# Patient Record
Sex: Male | Born: 1965
Health system: Southern US, Community
[De-identification: ages and names within clinical notes are randomized; demographics above are authoritative.]

---

## 2021-01-04 ENCOUNTER — Other Ambulatory Visit (HOSPITAL_COMMUNITY): Payer: Self-pay | Admitting: Emergency Medicine

## 2021-01-04 ENCOUNTER — Encounter (HOSPITAL_BASED_OUTPATIENT_CLINIC_OR_DEPARTMENT_OTHER): Payer: Self-pay | Admitting: Emergency Medicine

## 2021-01-04 ENCOUNTER — Emergency Department (HOSPITAL_BASED_OUTPATIENT_CLINIC_OR_DEPARTMENT_OTHER): Payer: Worker's Compensation

## 2021-01-04 ENCOUNTER — Other Ambulatory Visit: Payer: Self-pay

## 2021-01-04 ENCOUNTER — Emergency Department (HOSPITAL_BASED_OUTPATIENT_CLINIC_OR_DEPARTMENT_OTHER)
Admission: EM | Admit: 2021-01-04 | Discharge: 2021-01-04 | Disposition: A | Discharge status: Home / Self Care | Payer: Worker's Compensation | Attending: Emergency Medicine | Admitting: Emergency Medicine

## 2021-01-04 DIAGNOSIS — S59911A Unspecified injury of right forearm, initial encounter: Secondary | ICD-10-CM | POA: Diagnosis present

## 2021-01-04 DIAGNOSIS — Y990 Civilian activity done for income or pay: Secondary | ICD-10-CM | POA: Diagnosis not present

## 2021-01-04 DIAGNOSIS — Y9389 Activity, other specified: Secondary | ICD-10-CM | POA: Diagnosis not present

## 2021-01-04 DIAGNOSIS — Y9289 Other specified places as the place of occurrence of the external cause: Secondary | ICD-10-CM | POA: Insufficient documentation

## 2021-01-04 DIAGNOSIS — S56911A Strain of unspecified muscles, fascia and tendons at forearm level, right arm, initial encounter: Secondary | ICD-10-CM | POA: Diagnosis not present

## 2021-01-04 DIAGNOSIS — X500XXA Overexertion from strenuous movement or load, initial encounter: Secondary | ICD-10-CM | POA: Diagnosis not present

## 2021-01-04 LAB — CBC WITH DIFFERENTIAL/PLATELET
Abs Immature Granulocytes: 0.03 10*3/uL (ref 0.00–0.07)
Basophils Absolute: 0.1 10*3/uL (ref 0.0–0.1)
Basophils Relative: 1 %
Eosinophils Absolute: 0.2 10*3/uL (ref 0.0–0.5)
Eosinophils Relative: 3 %
HCT: 46.6 % (ref 39.0–52.0)
Hemoglobin: 15.6 g/dL (ref 13.0–17.0)
Immature Granulocytes: 1 %
Lymphocytes Relative: 25 %
Lymphs Abs: 1.6 10*3/uL (ref 0.7–4.0)
MCH: 30 pg (ref 26.0–34.0)
MCHC: 33.5 g/dL (ref 30.0–36.0)
MCV: 89.6 fL (ref 80.0–100.0)
Monocytes Absolute: 0.5 10*3/uL (ref 0.1–1.0)
Monocytes Relative: 8 %
Neutro Abs: 4 10*3/uL (ref 1.7–7.7)
Neutrophils Relative %: 62 %
Platelets: 252 10*3/uL (ref 150–400)
RBC: 5.2 MIL/uL (ref 4.22–5.81)
RDW: 12 % (ref 11.5–15.5)
WBC: 6.4 10*3/uL (ref 4.0–10.5)
nRBC: 0 % (ref 0.0–0.2)

## 2021-01-04 LAB — BASIC METABOLIC PANEL
Anion gap: 8 (ref 5–15)
BUN: 20 mg/dL (ref 6–20)
CO2: 25 mmol/L (ref 22–32)
Calcium: 9.2 mg/dL (ref 8.9–10.3)
Chloride: 106 mmol/L (ref 98–111)
Creatinine, Ser: 0.79 mg/dL (ref 0.61–1.24)
GFR, Estimated: >60 mL/min (ref >60)
Glucose, Bld: 111 mg/dL — ABNORMAL HIGH (ref 70–99)
Potassium: 4.2 mmol/L (ref 3.5–5.1)
Sodium: 139 mmol/L (ref 135–145)

## 2021-01-04 LAB — CK: Total CK: 125 U/L (ref 49–397)

## 2021-01-04 MED ORDER — METHOCARBAMOL 500 MG PO TABS: 500.0000 mg | ORAL_TABLET | Freq: Two times a day (BID) | ORAL | 0 refills | Status: AC

## 2021-01-04 MED ORDER — KETOROLAC TROMETHAMINE 30 MG/ML IJ SOLN
30.0000 mg | Freq: Once | INTRAMUSCULAR | Status: AC
  Administered 2021-01-04: 30 mg via INTRAVENOUS
  Filled 2021-01-04: qty 1

## 2021-01-04 MED ORDER — KETOROLAC TROMETHAMINE 30 MG/ML IJ SOLN: 30.0000 mg | Freq: Once | INTRAMUSCULAR | Status: DC

## 2021-01-04 MED ORDER — IBUPROFEN 600 MG PO TABS: 600.0000 mg | ORAL_TABLET | Freq: Four times a day (QID) | ORAL | 0 refills | Status: AC | PRN

## 2021-01-04 MED ORDER — SODIUM CHLORIDE 0.9 % IV BOLUS
1000.0000 mL | Freq: Once | INTRAVENOUS | Status: AC
  Administered 2021-01-04: 1000 mL via INTRAVENOUS

## 2021-01-04 MED FILL — IBUPROFEN 600 MG TABLET: 600 | 7 days supply | Qty: 30 | Fill #0

## 2021-01-04 MED FILL — METHOCARBAMOL 500 MG TABLET: 500 | 10 days supply | Qty: 20 | Fill #0

## 2021-01-04 NOTE — ED Triage Notes (Addendum)
Was lifting a heavy door yesterday at work  and lost control tried to catch it with rt arm  Hurt wrist to  Upper arm elbow and wrist pain hurt to move  Good pulse , neuro and < 3 sec cap refill

## 2021-01-04 NOTE — ED Provider Notes (Signed)
[facility] EMERGENCY DEPARTMENT Provider Note   CSN: [id number] Arrival date & time: 01/04/21  8469     History Chief Complaint  Patient presents with  . Arm Pain    Randy Wells is a 55 y.o. male.  Pt presents to the ED today with right arm pain.  Pt said he was lifting a heavy door at work yesterday and it started to fall.  He was able to keep it from falling, but he felt like he strained his right arm.  Pt said it hurts to extend his right wrist.  He is right handed.          History reviewed. No pertinent past medical history.  There are no problems to display for this patient.   No family history on file.  Social History   Tobacco Use  . Smoking status: Never Smoker  . Smokeless tobacco: Never Used  Substance Use Topics  . Alcohol use: Yes  . Drug use: Never    Home Medications Prior to Admission medications   Medication Sig Start Date End Date Taking? Authorizing Provider  ibuprofen (ADVIL) 600 MG tablet Take 1 tablet (600 mg total) by mouth every 6 (six) hours as needed. 01/04/21  Yes Jacalyn Lefevre, MD  methocarbamol (ROBAXIN) 500 MG tablet Take 1 tablet (500 mg total) by mouth 2 (two) times daily. 01/04/21  Yes Jacalyn Lefevre, MD    Allergies    Patient has no known allergies.  Review of Systems   Review of Systems  Musculoskeletal:       Right forearm pain  All other systems reviewed and are negative.   Physical Exam Updated Vital Signs BP 131/67 (BP Location: Right Arm)   Pulse 78   Temp 98.4 F (36.9 C) (Oral)   Resp 16   Ht 5\' 7"  (1.702 m)   Wt 94.8 kg   SpO2 99%   BMI 32.73 kg/m   Physical Exam Vitals and nursing note reviewed.  Constitutional:      Appearance: Normal appearance.  HENT:     Head: Normocephalic and atraumatic.     Right Ear: External ear normal.     Left Ear: External ear normal.     Nose: Nose normal.     Mouth/Throat:     Mouth: Mucous membranes are moist.     Pharynx: Oropharynx is clear.   Eyes:     Extraocular Movements: Extraocular movements intact.     Conjunctiva/sclera: Conjunctivae normal.     Pupils: Pupils are equal, round, and reactive to light.  Cardiovascular:     Rate and Rhythm: Normal rate and regular rhythm.     Pulses: Normal pulses.     Heart sounds: Normal heart sounds.  Pulmonary:     Effort: Pulmonary effort is normal.     Breath sounds: Normal breath sounds.  Abdominal:     General: Abdomen is flat. Bowel sounds are normal.     Palpations: Abdomen is soft.  Musculoskeletal:     Cervical back: Normal range of motion and neck supple.     Comments: Right bracheoradialis m tenderness and swelling.  Compartments soft  Skin:    General: Skin is warm.     Capillary Refill: Capillary refill takes less than 2 seconds.  Neurological:     General: No focal deficit present.     Mental Status: He is alert and oriented to person, place, and time.  Psychiatric:        Mood and Affect:  Mood normal.        Behavior: Behavior normal.     ED Results / Procedures / Treatments   Labs (all labs ordered are listed, but only abnormal results are displayed) Labs Reviewed  BASIC METABOLIC PANEL - Abnormal; Notable for the following components:      Result Value   Glucose, Bld 111 (*)    All other components within normal limits  CBC WITH DIFFERENTIAL/PLATELET  CK    EKG None  Radiology DG Forearm Right  Result Date: 01/04/2021 CLINICAL DATA:  Pain status post injury EXAM: RIGHT FOREARM - 2 VIEW COMPARISON:  None. FINDINGS: There is no evidence of fracture or other focal bone lesions. Soft tissues are unremarkable. IMPRESSION: Negative. Electronically Signed   By: Acquanetta Belling M.D.   On: 01/04/2021 10:57    Procedures Procedures   Medications Ordered in ED Medications  ketorolac (TORADOL) 30 MG/ML injection 30 mg (30 mg Intravenous Given 01/04/21 1046)  sodium chloride 0.9 % bolus 1,000 mL (1,000 mLs Intravenous New Bag/Given 01/04/21 1050)    ED  Course  I have reviewed the triage vital signs and the nursing notes.  Pertinent labs & imaging results that were available during my care of the patient were reviewed by me and considered in my medical decision making (see chart for details).    MDM Rules/Calculators/A&P                          xrays neg.  No rhabdo.  Pt is stable for d/c.  He is to f/u with ortho if sx persist.  Return if worse.  Final Clinical Impression(s) / ED Diagnoses Final diagnoses:  Forearm strain, right, initial encounter    Rx / DC Orders ED Discharge Orders         Ordered    ibuprofen (ADVIL) 600 MG tablet  Every 6 hours PRN        01/04/21 1103    methocarbamol (ROBAXIN) 500 MG tablet  2 times daily        01/04/21 1103           Jacalyn Lefevre, MD 01/04/21 1105

## 2022-01-27 IMAGING — CR DG FOREARM 2V*R*
2 series · 2 of 2 positions shown · non-contrast
Comparison: None.

CLINICAL DATA: Pain status post injury

EXAM:
RIGHT FOREARM - 2 VIEW

[x forearm ap right]
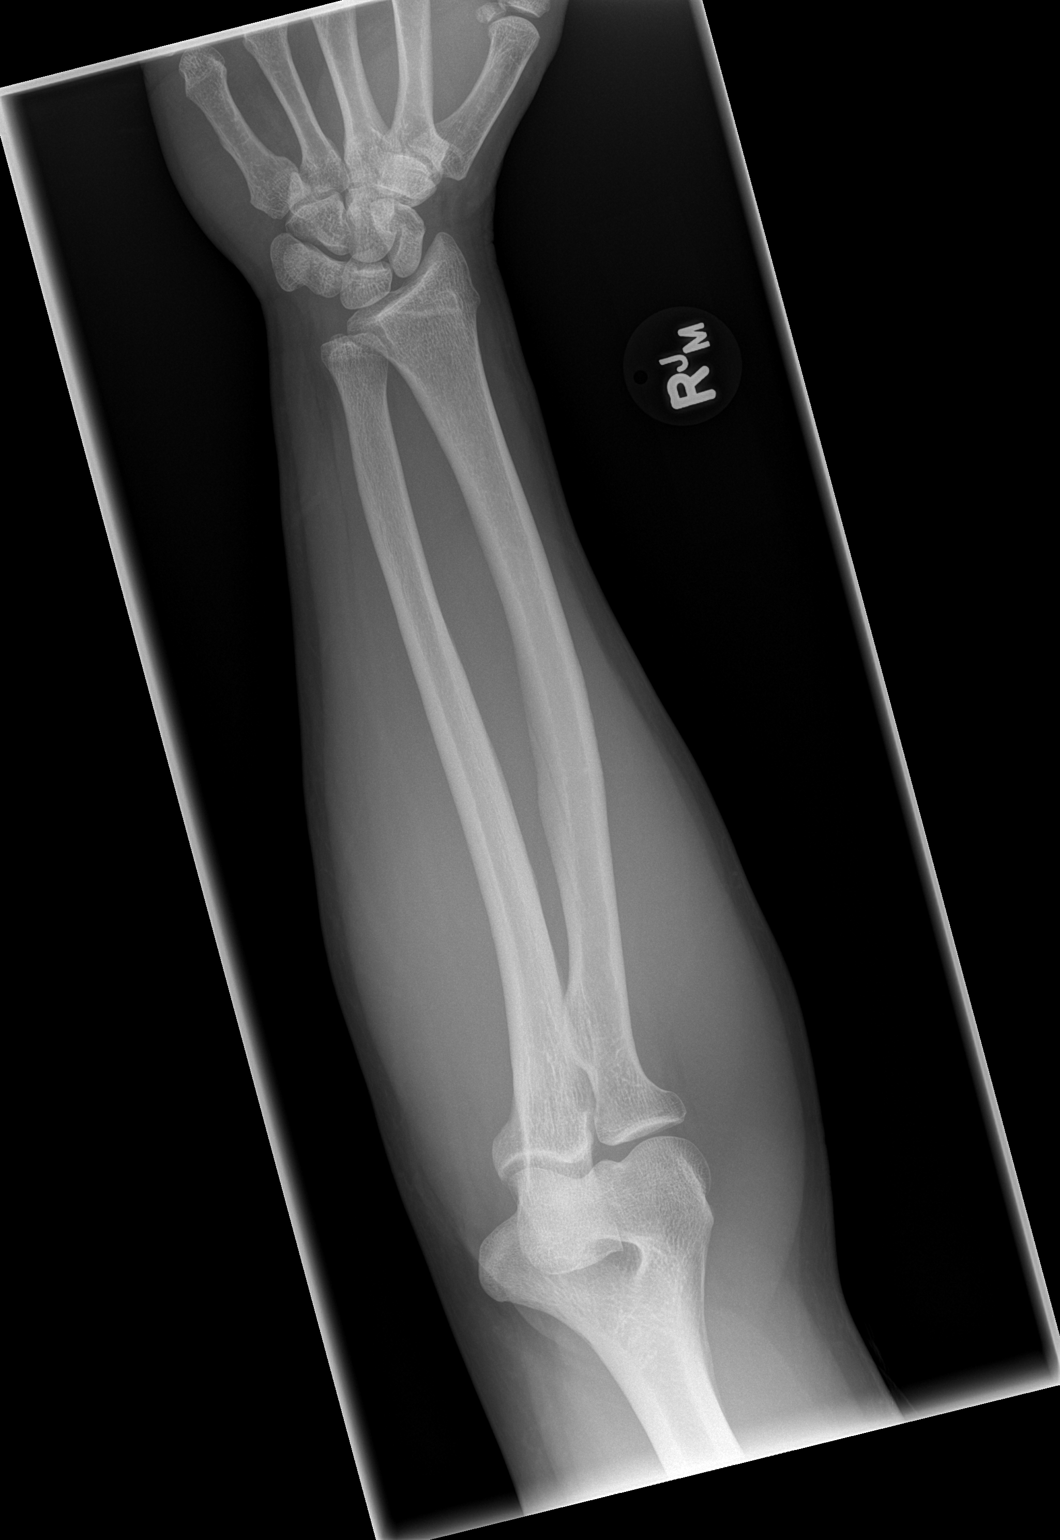

[x forearm lat right]
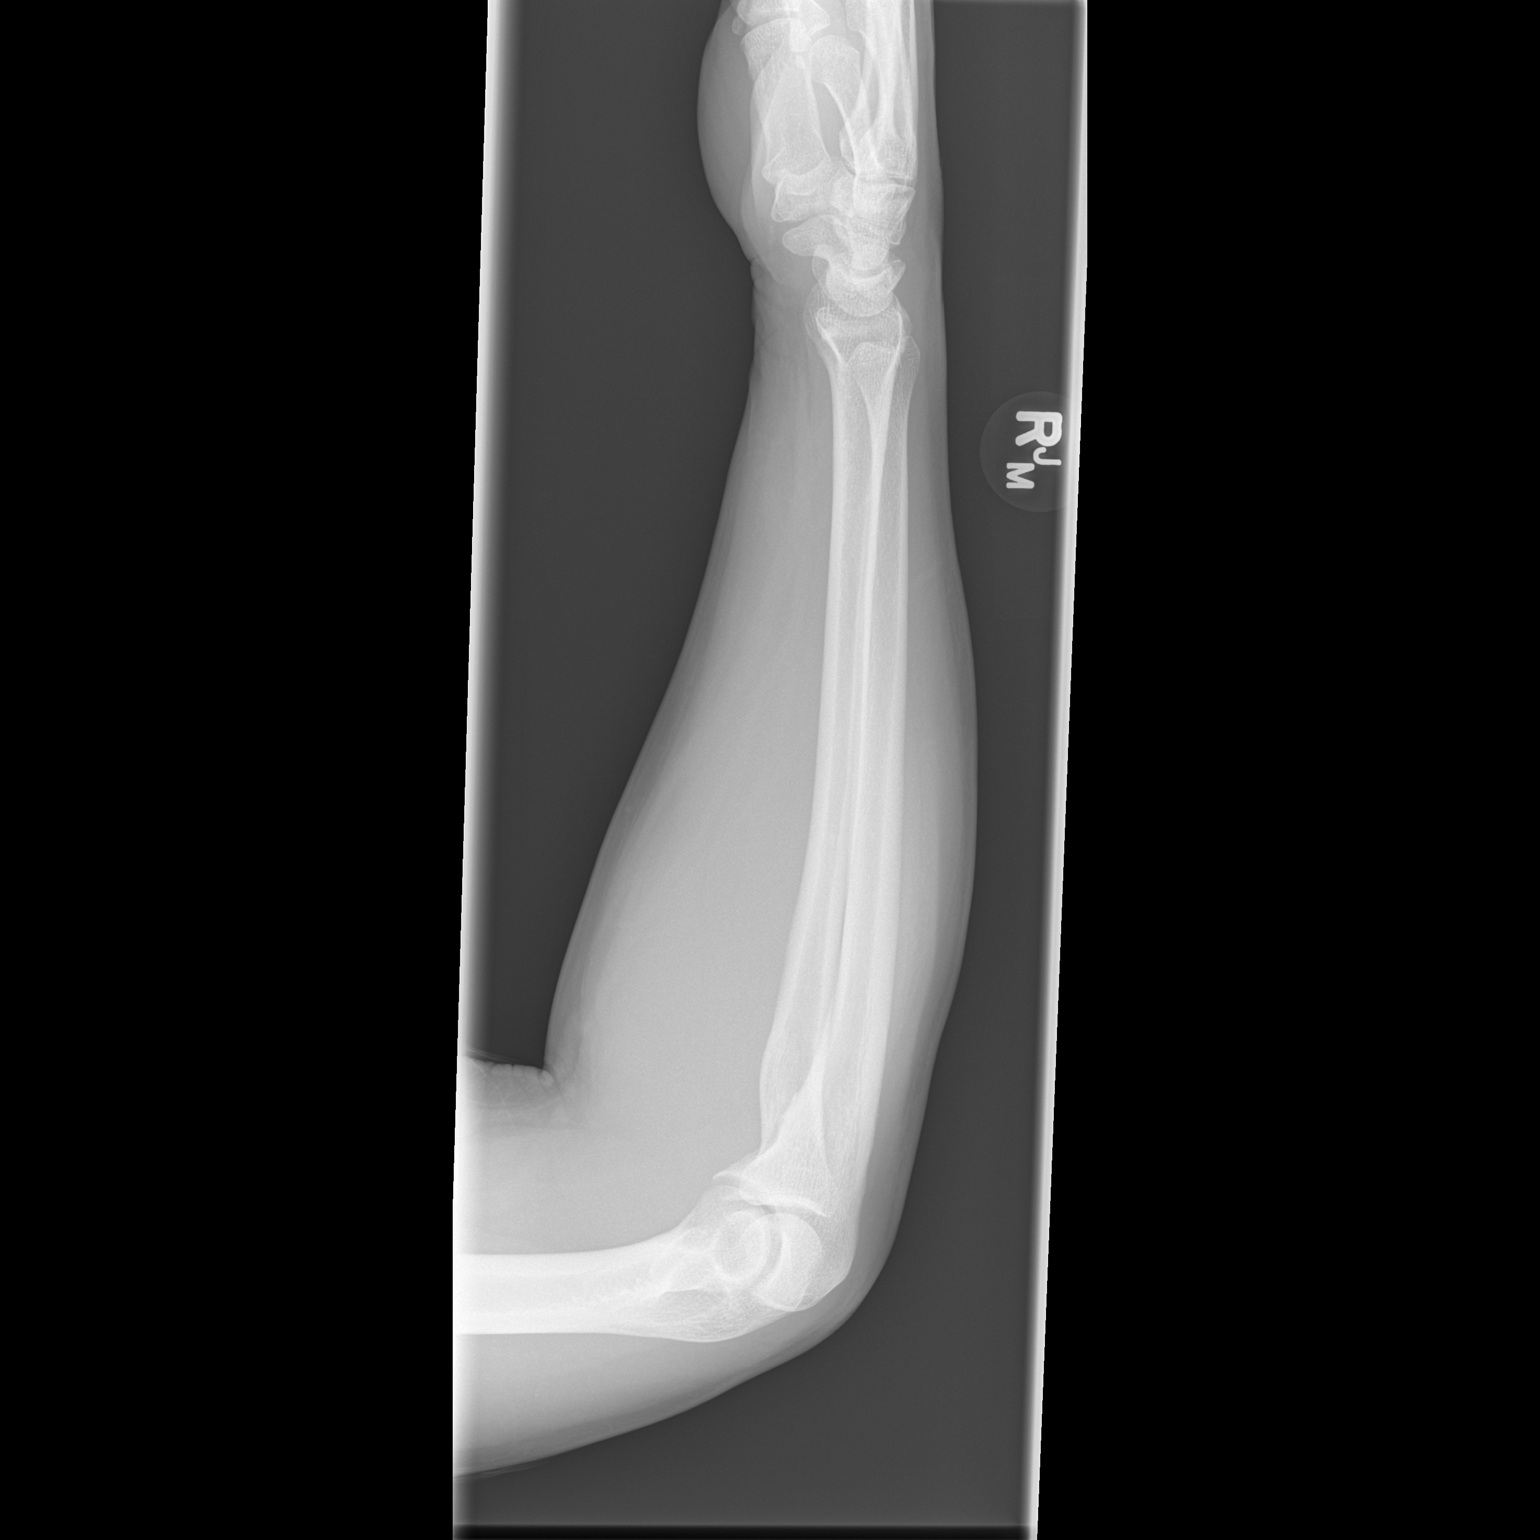

[2 of 2 positions shown; findings below may reference images not displayed]

FINDINGS: There is no evidence of fracture or other focal bone lesions. Soft
tissues are unremarkable.
IMPRESSION: Negative.
